# Patient Record
Sex: Male | Born: 1975 | Smoking: Never smoker
Health system: Southern US, Academic
[De-identification: ages and names within clinical notes are randomized; demographics above are authoritative.]

## PROBLEM LIST (undated history)

## (undated) DIAGNOSIS — Z9889 Other specified postprocedural states: Secondary | ICD-10-CM

## (undated) DIAGNOSIS — G4730 Sleep apnea, unspecified: Secondary | ICD-10-CM

## (undated) DIAGNOSIS — M778 Other enthesopathies, not elsewhere classified: Secondary | ICD-10-CM

## (undated) DIAGNOSIS — Z9989 Dependence on other enabling machines and devices: Secondary | ICD-10-CM

## (undated) DIAGNOSIS — K219 Gastro-esophageal reflux disease without esophagitis: Secondary | ICD-10-CM

## (undated) DIAGNOSIS — I10 Essential (primary) hypertension: Secondary | ICD-10-CM

## (undated) DIAGNOSIS — Z87442 Personal history of urinary calculi: Secondary | ICD-10-CM

## (undated) DIAGNOSIS — E785 Hyperlipidemia, unspecified: Secondary | ICD-10-CM

## (undated) DIAGNOSIS — R011 Cardiac murmur, unspecified: Secondary | ICD-10-CM

## (undated) HISTORY — PX: HX WISDOM TEETH EXTRACTION: SHX21

## (undated) HISTORY — PX: GANGLION CYST EXCISION: SHX1691

## (undated) HISTORY — PX: LITHOTRIPSY: SUR834

## (undated) HISTORY — PX: HEMORROIDECTOMY: SUR656

## (undated) HISTORY — PX: HX TONSILLECTOMY: SHX27

---

## 2020-02-18 ENCOUNTER — Other Ambulatory Visit: Payer: Self-pay

## 2020-02-18 ENCOUNTER — Ambulatory Visit (HOSPITAL_BASED_OUTPATIENT_CLINIC_OR_DEPARTMENT_OTHER): Payer: Managed Care, Other (non HMO) | Admitting: SURGERY OF THE HAND

## 2020-02-18 ENCOUNTER — Encounter (HOSPITAL_COMMUNITY): Payer: Self-pay

## 2020-02-18 ENCOUNTER — Encounter (HOSPITAL_BASED_OUTPATIENT_CLINIC_OR_DEPARTMENT_OTHER): Payer: Self-pay | Admitting: SURGERY OF THE HAND

## 2020-02-18 ENCOUNTER — Ambulatory Visit
Admission: RE | Admit: 2020-02-18 | Discharge: 2020-02-18 | Disposition: A | Discharge status: Home / Self Care | Payer: Managed Care, Other (non HMO) | Source: Ambulatory Visit | Attending: SURGERY OF THE HAND | Admitting: SURGERY OF THE HAND

## 2020-02-18 ENCOUNTER — Ambulatory Visit (HOSPITAL_COMMUNITY)
Admission: RE | Admit: 2020-02-18 | Discharge: 2020-02-18 | Disposition: A | Discharge status: Home / Self Care | Payer: Managed Care, Other (non HMO) | Source: Ambulatory Visit

## 2020-02-18 ENCOUNTER — Ambulatory Visit (INDEPENDENT_AMBULATORY_CARE_PROVIDER_SITE_OTHER): Payer: Managed Care, Other (non HMO) | Admitting: Rheumatology

## 2020-02-18 VITALS — BP 129/86 | HR 91 | Temp 97.2°F | Resp 16 | Wt 243.2 lb

## 2020-02-18 VITALS — BP 118/82 | HR 78 | Temp 98.4°F | Ht 69.02 in | Wt 243.4 lb

## 2020-02-18 DIAGNOSIS — Z01818 Encounter for other preprocedural examination: Secondary | ICD-10-CM | POA: Insufficient documentation

## 2020-02-18 DIAGNOSIS — S66912A Strain of unspecified muscle, fascia and tendon at wrist and hand level, left hand, initial encounter: Secondary | ICD-10-CM

## 2020-02-18 DIAGNOSIS — I10 Essential (primary) hypertension: Secondary | ICD-10-CM

## 2020-02-18 DIAGNOSIS — X509XXA Other and unspecified overexertion or strenuous movements or postures, initial encounter: Secondary | ICD-10-CM | POA: Insufficient documentation

## 2020-02-18 DIAGNOSIS — S66919A Strain of unspecified muscle, fascia and tendon at wrist and hand level, unspecified hand, initial encounter: Secondary | ICD-10-CM

## 2020-02-18 DIAGNOSIS — S66812A Strain of other specified muscles, fascia and tendons at wrist and hand level, left hand, initial encounter: Secondary | ICD-10-CM | POA: Insufficient documentation

## 2020-02-18 HISTORY — DX: Cardiac murmur, unspecified: R01.1

## 2020-02-18 HISTORY — DX: Sleep apnea, unspecified: G47.30

## 2020-02-18 HISTORY — DX: Other specified postprocedural states: Z98.890

## 2020-02-18 HISTORY — DX: Essential (primary) hypertension: I10

## 2020-02-18 HISTORY — DX: Personal history of urinary calculi: Z87.442

## 2020-02-18 HISTORY — DX: Dependence on other enabling machines and devices: Z99.89

## 2020-02-18 HISTORY — DX: Other enthesopathies, not elsewhere classified: M77.8

## 2020-02-18 HISTORY — DX: Gastro-esophageal reflux disease without esophagitis: K21.9

## 2020-02-18 HISTORY — DX: Hyperlipidemia, unspecified: E78.5

## 2020-02-18 LAB — BASIC METABOLIC PANEL
ANION GAP: 10 mmol/L (ref 4–13)
BUN/CREA RATIO: 15 (ref 6–22)
BUN: 19 mg/dL (ref 8–25)
CALCIUM: 9.3 mg/dL (ref 8.5–10.2)
CHLORIDE: 104 mmol/L (ref 96–111)
CO2 TOTAL: 26 mmol/L (ref 22–32)
CREATININE: 1.23 mg/dL (ref 0.62–1.27)
GLUCOSE: 95 mg/dL (ref 65–125)
POTASSIUM: 3.6 mmol/L (ref 3.5–5.1)
SODIUM: 140 mmol/L (ref 136–145)

## 2020-02-18 LAB — ECG 12-LEAD (PERFORMED IN PREADMISSION UNIT ONLY)
Atrial Rate: 85 {beats}/min
Calculated P Axis: 41 degrees
Calculated T Axis: 39 degrees
PR Interval: 124 ms
QRS Duration: 100 ms
QT Interval: 374 ms
QTC Calculation: 445 ms
Ventricular rate: 85 {beats}/min

## 2020-02-18 LAB — POC BLOOD GLUCOSE (RESULTS): GLUCOSE, POC: 109 mg/dl — ABNORMAL HIGH (ref 70–105)

## 2020-02-19 NOTE — H&P (Signed)
PATIENT NAME: Martin Wilkins NUMBER:  [id number]  DATE OF SERVICE: 02/18/2020  DATE OF BIRTH:      HISTORY AND PHYSICAL    CHIEF COMPLAINT:  Left wrist pain.    HISTORY OF PRESENT ILLNESS:  Mr. Chester Sibert is a 44 year old male, new patient.  He is right-hand dominant.  The patient indicates that on January 16, 2020, he was swinging a suitcase up around his shoulder with his left wrist when he felt a pop and burning.  Most of the pain according to the patient seems to be around the first dorsal compartment area.  He was evaluated by his primary care down in the [place] area and an MRI of his left wrist was ordered.  This was completed on February 11, 2020.  The MRI result findings indicated a tear of the triangular fibrocartilage, diffuse thickening of the abductor pollicis longus, the extensor pollicis brevis was not seen, and he had tendinopathy of the extensor carpi ulnaris.  The patient has been wearing a splint, which he indicates helps to some degree.  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  He has been taking Aleve for the discomfort.  Quality of discomfort is described as "aching, sharp, dull, catching, locking".  The only other previous history he has on that wrist was a cyst removal 20-some years ago.    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    PAST SURGICAL HISTORY:  Hemorrhoidectomy, wisdom teeth removal, and tonsillectomy.    ALLERGIES:  No known allergies.    CURRENT MEDICATIONS:  1. Crestor.  2. Lisinopril.  3. Metoprolol.  4. Prilosec.    SOCIAL HISTORY:  Employed, currently on light duty as a Arboriculturist.  Denies smoking or the use of other nicotine products.  Denies recreational drug use.  Alcohol use is seldom.  Married.  He will accept blood  transfusions if needed.    FAMILY HISTORY:  Positive for diabetes and hypertension.    REVIEW OF SYSTEMS:  Positive for numbness and tingling, which is present in his right hand, weakness of grip and stiffness present in his left hand.    PHYSICAL EXAMINATION:  General appearance:  Appears healthy, well developed, well nourished, and in no acute distress.   Vital signs:  Blood pressure 118/82, pulse 78, temperature 36.9 degrees Celsius, respirations 16.   Skin:  Warm, dry and intact.  No evidence of bruising.   Head:  Normocephalic without evidence of trauma.   Eyes ears, nose and throat:  Eyes and ears are symmetrical.   Chest:  Breathing is nonlabored.  Respirations are easy and regular.   Cardiac:  Radial pulse is regular.   Abdomen:  Soft, nondistended.   Musculoskeletal:  Exam is limited to his left upper extremity.  Motor function of his hand is intact.  Able to extend his thumb, make an O, cross 2 fingers and spread them out.  Sensation is normal to light touch over the radial, ulnar, and median nerve distribution.  The patient is nontender over the ulnar aspect of his wrist, nontender over the mid aspect of his wrist.  In regard to his radial wrist, he is tender over the first dorsal compartment with palpation and manipulation of the thumb/wrist.   Neurological:  Alert and oriented x4.   Psychological:  Affect is appropriate to today's visit.    IMAGING DATA:  No x-rays were obtained today, however, Dr. Osvaldo Human did go in and review the MRI that was completed in [place] on February 11, 2020.    ASSESSMENT:  This is a 44 year old male with an extensor pollicis brevis rupture and abductor pollicis brevis tendinitis of the left wrist.    PLAN:  1. Discussed with the patient that the other findings on the MRI are consistent with age-related changes in regard to the TFCC tear and the ECU tendinitis.  What we feel he has done is ruptured the EPB and he has a generalized tendinitis in the first dorsal compartment  around the APB.  We provided the patient with a couple different options.  One would be to immobilize the wrist for further period of time to see if it starts to decrease his discomfort.  The other would be a surgical option of performing a release of the first dorsal compartment, as well as attempting to reattach the EPB tendon.  The patient was interested in the surgical option.  A surgery card was filled out and scanned over to Dr. Inda Castle.  We were able to make him a PAT appointment over at the [facility] today and his consent was scanned into media.  2. Surgery is planned for next week, February 24, 2020, and I discussed with Mr. Trammel avoiding any blood thinning products such as anti-inflammatories, vitamins, fish oil capsules, etc.  3. Discussed with Mr. Cappuccio that based on what occurs at the time of surgery, he may be off from work a minimum of 6 weeks, but if we do a tendon repair, then it would be up to 12 weeks before he could return to his regular work duties.  In any case, he should be off at least for 2 weeks immediately following surgery before a light duty return is considered.  4. Mr. Defina was given the opportunity to ask questions today and appeared satisfied with the treatment plan.     A shared visit with the cosigning physician was performed.    Colleen Merlyn Albert, APRN, NP-C  [facility]       Mattie Marlin, MD  Associate Professor, [facility] Department of Orthopaedics    I personally interviewed and examined this patient. I made the diagnosis and recommended the plan of care. The note was documented by the APRN who saw them with me.  My findings are consistant with Tendon rupture of wrist, initial encounter.  Mattie Marlin, MD 02/20/2020, 14:39                DD:  02/18/2020 16:17:08  DT:  02/18/2020 18:31:12 FP  D#:  [id number]

## 2020-02-24 ENCOUNTER — Ambulatory Visit (HOSPITAL_COMMUNITY): Payer: Managed Care, Other (non HMO) | Admitting: NURSE PRACTITIONER

## 2020-02-24 ENCOUNTER — Ambulatory Visit (HOSPITAL_BASED_OUTPATIENT_CLINIC_OR_DEPARTMENT_OTHER): Payer: Managed Care, Other (non HMO) | Admitting: Specialist

## 2020-02-24 ENCOUNTER — Encounter (HOSPITAL_COMMUNITY): Payer: Managed Care, Other (non HMO) | Admitting: SURGERY OF THE HAND

## 2020-02-24 ENCOUNTER — Encounter (HOSPITAL_COMMUNITY)
Admission: RE | Disposition: A | Discharge status: Home / Self Care | Payer: Self-pay | Source: Ambulatory Visit | Attending: SURGERY OF THE HAND

## 2020-02-24 ENCOUNTER — Inpatient Hospital Stay
Admission: RE | Admit: 2020-02-24 | Discharge: 2020-02-24 | Disposition: A | Discharge status: Home / Self Care | Payer: Managed Care, Other (non HMO) | Source: Ambulatory Visit | Attending: SURGERY OF THE HAND | Admitting: SURGERY OF THE HAND

## 2020-02-24 ENCOUNTER — Other Ambulatory Visit: Payer: Self-pay

## 2020-02-24 ENCOUNTER — Encounter (HOSPITAL_COMMUNITY): Payer: Self-pay | Admitting: SURGERY OF THE HAND

## 2020-02-24 DIAGNOSIS — S66812A Strain of other specified muscles, fascia and tendons at wrist and hand level, left hand, initial encounter: Secondary | ICD-10-CM

## 2020-02-24 DIAGNOSIS — M65822 Other synovitis and tenosynovitis, left upper arm: Secondary | ICD-10-CM | POA: Insufficient documentation

## 2020-02-24 DIAGNOSIS — Z01818 Encounter for other preprocedural examination: Secondary | ICD-10-CM

## 2020-02-24 DIAGNOSIS — M65842 Other synovitis and tenosynovitis, left hand: Secondary | ICD-10-CM

## 2020-02-24 DIAGNOSIS — Z9889 Other specified postprocedural states: Secondary | ICD-10-CM

## 2020-02-24 SURGERY — TENOSYNOVECTOMY
Anesthesia: General | Site: Finger | Laterality: Left | Wound class: Clean Wound: Uninfected operative wounds in which no inflammation occurred

## 2020-02-24 MED ORDER — SODIUM CHLORIDE 0.9 % (FLUSH) INJECTION SYRINGE
2.0000 mL | INJECTION | INTRAMUSCULAR | Status: DC | PRN
Start: 2020-02-24 — End: 2020-02-24

## 2020-02-24 MED ORDER — LACTATED RINGERS INTRAVENOUS SOLUTION
INTRAVENOUS | Status: DC
Start: 2020-02-24 — End: 2020-02-24

## 2020-02-24 MED ORDER — KETAMINE 10 MG/ML INJECTION WRAPPER
Freq: Once | INTRAMUSCULAR | Status: DC | PRN
Start: 2020-02-24 — End: 2020-02-24
  Administered 2020-02-24: 15:00:00 30 mg via INTRAVENOUS

## 2020-02-24 MED ORDER — SCOPOLAMINE 1 MG OVER 3 DAYS TRANSDERMAL PATCH
1.0000 | MEDICATED_PATCH | TRANSDERMAL | Status: DC
Start: 2020-02-24 — End: 2020-02-24
  Administered 2020-02-24: 1 via TRANSDERMAL

## 2020-02-24 MED ORDER — ONDANSETRON HCL (PF) 4 MG/2 ML INJECTION SOLUTION
Freq: Once | INTRAMUSCULAR | Status: DC | PRN
Start: 2020-02-24 — End: 2020-02-24
  Administered 2020-02-24: 4 mg via INTRAVENOUS

## 2020-02-24 MED ORDER — FENTANYL (PF) 50 MCG/ML INJECTION SOLUTION
25.0000 ug | INTRAMUSCULAR | Status: DC | PRN
Start: 2020-02-24 — End: 2020-02-24

## 2020-02-24 MED ORDER — MIDAZOLAM 1 MG/ML INJECTION SOLUTION
Freq: Once | INTRAMUSCULAR | Status: DC | PRN
Start: 2020-02-24 — End: 2020-02-24
  Administered 2020-02-24: 2 mg via INTRAVENOUS

## 2020-02-24 MED ORDER — FENTANYL (PF) 50 MCG/ML INJECTION SOLUTION
Freq: Once | INTRAMUSCULAR | Status: DC | PRN
Start: 2020-02-24 — End: 2020-02-24
  Administered 2020-02-24: 100 ug via INTRAVENOUS

## 2020-02-24 MED ORDER — DEXTROSE 5 % IN WATER (D5W) INTRAVENOUS SOLUTION
2.0000 g | Freq: Once | INTRAVENOUS | Status: AC
Start: 2020-02-24 — End: 2020-02-24
  Administered 2020-02-24: 2 g via INTRAVENOUS
  Filled 2020-02-24: qty 20

## 2020-02-24 MED ORDER — FENTANYL (PF) 50 MCG/ML INJECTION SOLUTION
12.5000 ug | INTRAMUSCULAR | Status: DC | PRN
Start: 2020-02-24 — End: 2020-02-24

## 2020-02-24 MED ORDER — BUPIVACAINE (PF) 0.25 % (2.5 MG/ML) INJECTION SOLUTION
20.00 mL | Freq: Once | INTRAMUSCULAR | Status: AC
Start: 2020-02-24 — End: 2020-02-24
  Administered 2020-02-24: 10 mL
  Filled 2020-02-24: qty 30

## 2020-02-24 MED ORDER — APREPITANT 40 MG CAPSULE
40.00 mg | ORAL_CAPSULE | Freq: Once | ORAL | Status: AC
Start: 2020-02-24 — End: 2020-02-24
  Administered 2020-02-24: 40 mg via ORAL

## 2020-02-24 MED ORDER — HYDROMORPHONE 2 MG/ML INJECTION SYRINGE
0.4000 mg | INJECTION | INTRAMUSCULAR | Status: DC | PRN
Start: 2020-02-24 — End: 2020-02-24

## 2020-02-24 MED ORDER — SODIUM CHLORIDE 0.9 % (FLUSH) INJECTION SYRINGE
2.0000 mL | INJECTION | Freq: Three times a day (TID) | INTRAMUSCULAR | Status: DC
Start: 2020-02-24 — End: 2020-02-24

## 2020-02-24 MED ORDER — HYDROMORPHONE 2 MG/ML INJECTION SYRINGE
0.2000 mg | INJECTION | INTRAMUSCULAR | Status: DC | PRN
Start: 2020-02-24 — End: 2020-02-24

## 2020-02-24 MED ORDER — HYDROCODONE 5 MG-ACETAMINOPHEN 325 MG TABLET
1.0000 | ORAL_TABLET | Freq: Once | ORAL | Status: DC | PRN
Start: 2020-02-24 — End: 2020-02-24
  Administered 2020-02-24: 1 via ORAL
  Filled 2020-02-24 (×2): qty 1

## 2020-02-24 MED ORDER — LIDOCAINE (PF) 100 MG/5 ML (2 %) INTRAVENOUS SYRINGE
INJECTION | Freq: Once | INTRAVENOUS | Status: DC | PRN
Start: 2020-02-24 — End: 2020-02-24
  Administered 2020-02-24: 100 mg via INTRAVENOUS

## 2020-02-24 MED ORDER — HYDROCODONE 5 MG-ACETAMINOPHEN 325 MG TABLET
1.00 | ORAL_TABLET | ORAL | 0 refills | Status: DC | PRN
Start: 2020-02-24 — End: 2020-03-10

## 2020-02-24 MED ORDER — PROPOFOL 10 MG/ML INTRAVENOUS EMULSION
Freq: Once | INTRAVENOUS | Status: DC | PRN
Start: 2020-02-24 — End: 2020-02-24
  Administered 2020-02-24: 200 mg via INTRAVENOUS
  Administered 2020-02-24: 35 ug via INTRAVENOUS

## 2020-02-24 MED ORDER — SODIUM CHLORIDE 0.9 % (FLUSH) INJECTION SYRINGE
2.00 mL | INJECTION | Freq: Three times a day (TID) | INTRAMUSCULAR | Status: DC
Start: 2020-02-24 — End: 2020-02-24

## 2020-02-24 MED ORDER — SODIUM CHLORIDE 0.9 % (FLUSH) INJECTION SYRINGE
2.00 mL | INJECTION | INTRAMUSCULAR | Status: DC | PRN
Start: 2020-02-24 — End: 2020-02-24

## 2020-02-24 MED ORDER — DEXMEDETOMIDINE 4 MCG/ML IV DILUTION
Freq: Once | INTRAMUSCULAR | Status: DC | PRN
Start: 2020-02-24 — End: 2020-02-24
  Administered 2020-02-24: 15:00:00 40 ug via INTRAVENOUS

## 2020-02-24 SURGICAL SUPPLY — 26 items
BANDAGE HK-LOCK 5YDX2IN KNTD E LAS UNQ LPLK DSGN VLCR FRN (WOUND CARE/ENTEROSTOMAL SUPPLY) ×1
BANDAGE HOOK-LOCK 5YDX2IN STRL KNTD ELAS UNQ LPLK DSGN VELCRO FRN CLSR COTTON PLSTR YARN COMPRESS LF (WOUND CARE SUPPLY) ×2 IMPLANT
BANDAGE HOOK-LOCK 5YDX4IN STRL KNIT ELAS UNQ LPLK DSGN WSHBL FRN CLSR VELCRO PLSTR YARN COTTON (WOUND CARE SUPPLY) ×2 IMPLANT
BLANKET MISTRAL-AIR ADULT LWR BODY 55.9X40.2IN FRC AIR HI VOL BLWR INTUITIVE CONTROL PNL LRG LED (MISCELLANEOUS PT CARE ITEMS) ×2 IMPLANT
BLANKET WARMER 55.9INW X 40.2I_LOWER BODY MISTRAL-AIR (MISCELLANEOUS PT CARE ITEMS) ×1
CONV USE ITEM 337890 - PACK SURG BSIN 2 STRL LF  DISP (CUSTOM TRAYS & PACK) ×2 IMPLANT
DISCONTINUED NO SUB - PADDING CAST 4YDX2IN WEBRIL CO_T UNDCST MILD STRCH CHSV CRMP (CAST) ×2 IMPLANT
DISCONTINUED USE ITEM 83862 - PADDING CAST 4YDX3IN WEBRIL CO_T UNDCST MILD STRCH CHSV CRMP (CAST) ×2 IMPLANT
DRAPE POUCH INSTRU 2 POCKET_1018 10EA/BX (EQUIPMENT MINOR) ×1
GARMENT COMPRESS MED CALF CENTAURA NYL VASOGRAD LTWT BRTHBL SEQ FIL BLU 18- IN (ORTHOPEDICS (NOT IMPLANTS)) ×2 IMPLANT
GAUZE SPONGE 4 X 4IN STRL NON21426 (WOUND CARE/ENTEROSTOMAL SUPPLY) ×1
GAUZE SPONGE 4 X 4IN STRL_NON21426 (WOUND CARE SUPPLY) ×2 IMPLANT
GOWN SURG 2XL L3 REINF HKLP CLSR SET IN SLEEVE STRL LF  DISP BLU SIRUS SMS 49IN (PROTECTIVE PRODUCTS/GARMENTS) ×2 IMPLANT
PACK BASIN DBL CUSTOM (CUSTOM TRAYS & PACK) ×1
PACK HAND TRAY ORTHO REVISED_UTC (CUSTOM TRAYS & PACK) ×1
PACK SURG UTC HAND STRL DISP LF (CUSTOM TRAYS & PACK) ×2 IMPLANT
PAD ABDOMINAL 10X8IN CLU ABS N_WVN SFT HDRPHB BCK LF STRL (WOUND CARE/ENTEROSTOMAL SUPPLY) ×6
PADDING CAST 4YDX3IN WEBRIL CO T UNDCST MILD STRCH CHSV CRMP (CAST) ×1
POUCH 11X7IN 2 ADH STRP 2 CMPRT STRDRP INSTR PLASTIC STRL (EQUIPMENT MINOR) ×2 IMPLANT
SPLINT ORTHO 15FTX4IN RL FBGLS ORTHOGLASS LF (CAST) IMPLANT
SPLINT ORTHO 15FTX4IN WRST WRN_KL FREE STRCH PAD CVR INTLK (CAST)
SPLINT ORTHO 15FTX5IN RL FBGLS ORTHOGLASS LF (CAST) IMPLANT
SPLINT ORTHO 15FTX5IN WRST WRN_KL FREE STRCH PAD CVR FBGLS (CAST)
TRAY SKIN SCRUB 8IN VNYL COTTON 6 WNG 6 SPONGE STICK 2 TIP APPL DRY STRL LF (KITS & TRAYS (DISPOSABLE)) ×2 IMPLANT
TRAY SURG PREP SCR CR ESTM (KITS & TRAYS (DISPOSABLE)) ×1
XPAD ABDOMINAL 10X8IN CLU ABS N_WVN SFT HDRPHB BCK LF STRLX (WOUND CARE SUPPLY) ×4 IMPLANT

## 2020-02-24 NOTE — Anesthesia Transfer of Care (Signed)
ANESTHESIA TRANSFER OF CARE   Martin Wilkins is a 44 y.o. ,male,     had Procedure(s) with comments:  TENOSYNOVECTOMY - tenosynovectomy 4 tendons of compartment; release 1st dorsal compartment left wrist  performed  02/24/20   Primary Service: Mattie Marlin, MD    Past Medical History:   Diagnosis Date   . CPAP (continuous positive airway pressure) dependence     for sleep apnea   . GERD (gastroesophageal reflux disease)    . Heart murmur     seen by cardiology a "long time ago" and didn't need follow-up   . History of kidney stones    . HTN (hypertension)    . Hyperlipidemia    . PONV (postoperative nausea and vomiting)    . Sleep apnea    . Tendonitis of wrist, left       Allergy History as of 02/24/20      No Known Allergies              I completed my transfer of care / handoff to the receiving personnel during which we discussed:  Access, Antibiotics, Labs, PMHx, Expectation of post procedure, Airway, All key/critical aspects of case discussed and Fluids/Product    Post Location: PACU                                                                Last OR Temp: Temperature: 36.4 C (97.5 F)  ABG:  POTASSIUM   Date Value Ref Range Status   02/18/2020 3.6 3.5 - 5.1 mmol/L Final     CALCIUM   Date Value Ref Range Status   02/18/2020 9.3 8.5 - 10.2 mg/dL Final     Calculated P Axis   Date Value Ref Range Status   02/18/2020 41 degrees Final     Calculated T Axis   Date Value Ref Range Status   02/18/2020 39 degrees Final     Airway:* No LDAs found *  Blood pressure 101/67, pulse 85, temperature 36.4 C (97.5 F), resp. rate 16, SpO2 99 %.

## 2020-02-24 NOTE — Anesthesia Postprocedure Evaluation (Signed)
Anesthesia Post Op Evaluation    Patient: Martin Wilkins  Procedure(s) with comments:  TENOSYNOVECTOMY - tenosynovectomy 4 tendons of compartment; release 1st dorsal compartment left wrist    Last Vitals:Temperature: 36.4 C (97.5 F) (02/24/20 1545)  Heart Rate: 85 (02/24/20 1545)  BP (Non-Invasive): 101/67 (02/24/20 1545)  Respiratory Rate: 16 (02/24/20 1545)  SpO2: 99 % (02/24/20 1545)    No complications documented.    Patient is sufficiently recovered from the effects of anesthesia to participate in the evaluation and has returned to their pre-procedure level.  Patient location during evaluation: PACU       Patient participation: complete - patient participated  Level of consciousness: awake and alert and responsive to verbal stimuli    Pain management: adequate  Airway patency: patent    Anesthetic complications: no  Cardiovascular status: acceptable  Respiratory status: acceptable  Hydration status: acceptable  Patient post-procedure temperature: Pt Normothermic   PONV Status: Absent

## 2020-02-24 NOTE — Brief Op Note (Signed)
[facility]                                                     BRIEF OPERATIVE NOTE    Patient Name: Tolbert, Matheson Number: [id number]  Date of Service: 02/24/2020   Date of Birth:     All elements must be documented.    Pre-Operative Diagnosis: Left wrist EPB tendon rupture and APB tendinitis.   Post-Operative Diagnosis: Intact extensor tendons with scar adhesion first dorsal compartment left wrist.  Procedure(s)/Description:  Release of first dorsal compartment; tenosynovectomy of 4 tendons within the compartment, release of scar adhesion. Application of thumb spica splint.  Findings/Complexity (inherent to the procedure performed): See formal operative report.     Attending Surgeon: Jomarie Longs Prud'Homme MD  Assistant(s): Rolanda Jay APRN    Anesthesia Type: General/LMA  Tourniquet time left arm: 22 minutes. Local 0.25% marcaine anesthetic: 49mL.   Estimated Blood Loss:  Minimal  Blood Given: None  Fluids Given: IV Crystalloid  Complications (not routinely expected or not inherent to difficulty/nature of procedure): None  Characteristic Event (routinely expected or inherent to the difficulty/nature of the procedure): Routine.  Did the use of current and/or prior Anticoagulants impact the outcome of the case? no  Wound Class: Clean Wound: Uninfected operative wounds in which no inflammation occurred    Tubes: None  Drains: None  Specimens/ Cultures: None  Implants: None           Disposition: PACU - hemodynamically stable.  Condition: stable    Rolanda Jay, [place]

## 2020-02-24 NOTE — H&P (Signed)
[facility]                                                     H&P Update Form    Martin Wilkins, Martin Wilkins, 44 y.o. male  Date of Admission:  (Not on file)  Date of Birth:      02/24/2018    Pre-Surgical H & P updated the day of the procedure.  1.  H&P completed within 30 days of surgical procedure was performed by Rolanda Jay APRN on 02/18/2020 and has been reviewed, the patient has been examined, and no change has occured in the patients condition since the H&P was completed.     Comments: No medication changes, denies cp/sob/nvd/fevers/chills, surgical site marked, consent in chart.    2.  Patient continues to be appropiate candidate for planned surgical procedure. YES    --  Rolanda Jay APRN  Orthopaedics Dept  [address] [place], [place] 02/24/2020, 08:53

## 2020-02-24 NOTE — Progress Notes (Signed)
[facility]  Orthopaedics Post Op Note/Discharge Summary  Date of Service: 02/24/2020    Patient: Martin Wilkins  MRN: [id number]  DOB:     SUBJECTIVE:  Patient to PACU in hemodynamically stable condition. Resting comfortably, maintaining airway. Pain tolerable    OBJECTIVE:   NAD  AFVSS  Extremity: Left wrist dressing and thumb spica splint intact. Fingers pink and well perfused.    ASSESSMENT: 44 y.o. male day of surgery s/p release of left wrist first dorsal compartment. Tenosynovectomy of 4 tendons withing the compartment. Release of scar adhesion.    PLAN:  - resume all home medications   - WB left wrist: For first couple of days 2lb push, pull lift limit. Remove dressing and splint on day #3 and begin wrist motion.   - DVT prophylaxis:  Encourage ambulation and ROM in all normal extremities  - Antibiotics: Given intra operatively.  - pain control Norco 5/321m 1-2 tablets every 4 hours prn pain. #30 tablets.  Opioid Prescription - First Prescription  Diagnosis requiring prescription: rupture of left wrist EPB tendon.    Medication Dosage/Frequency being prescribed: Norco 5/3253m1 tablet every 4 hours prn pain. #30 tablets    I have reviewed prior medication history in the medical record for this patient.  I have also reviewed information contained in the state controlled prescription drug monitoring database.    I have discussed any history of non-pharmacological treatment with the patient.  The patient reports no prior treatment history.      The patient denies history of substance abuse treatment.      Physical exam findings and/or clinical history warranting use of opioid treatment include: post oeprative pain.     My goals for treatment include: gradual weaning of narcotic pain medication.    I have discussed the risk of opioid addiction with the patient.  I have also discussed the risk of using sedatives and alcohol while taking opioids. Based on my assessment, I believe the  planned course of treatment to be safe and effective.    - diet resume general diet when good po  - Will see back in Dr. PrFrederik Schmidtr [facility] Kagel's clinic in 2 weeks  - Dispo: D/C to home when recovery area discharge criteria is met    CoGillis SantaAPRN,NP-C 02/24/2020, 08:58  [facility]

## 2020-02-24 NOTE — Anesthesia Preprocedure Evaluation (Signed)
ANESTHESIA PRE-OP EVALUATION  Planned Procedure: REPAIR EXTENSOR TENDON FINGER (Left Finger)  Review of Systems    PONV       patient summary reviewed          Pulmonary   sleep apnea and CPAP,   Cardiovascular    Hypertension and murmur (Heart murmur  seen by cardiology a "long time ago" and didn't need follow-up ) ,       GI/Hepatic/Renal    neprholithiasis and GERD     Endo/Other    Tendonitis of wrist, left,       Neuro/Psych/MS        Cancer  negative hematology/oncology ROS,                    Physical Assessment      Patient summary reviewed   Airway       Mallampati: III      Neck ROM: full  Mouth Opening: good.            Dental                    Pulmonary    Breath sounds clear to auscultation       Cardiovascular    Rhythm: regular  Rate: Normal       Other findings            Plan  ASA 2     Planned anesthesia type: general                            Patient's NPO status is appropriate for Anesthesia.

## 2020-02-24 NOTE — Discharge Instructions (Signed)
SURGICAL DISCHARGE INSTRUCTIONS     Dr. Mattie Marlin, MD  performed your TENOSYNOVECTOMY today at the [facility]    [facility]:  Monday through Friday from 6 a.m. - 7 p.m.: [phone]  Between 7 p.m. - 6 a.m., weekends and holidays:  Call [facility] at [phone] or [phone].    PLEASE SEE WRITTEN HANDOUTS AS DISCUSSED BY YOUR NURSE:      SIGNS AND SYMPTOMS OF A WOUND / INCISION INFECTION   Be sure to watch for the following:   Increase in redness or red streaks near or around the wound or incision.   Increase in pain that is intense or severe and cannot be relieved by the pain medication that your doctor has given you.   Increase in swelling that cannot be relieved by elevation of a body part, or by applying ice, if permitted.   Increase in drainage, or if yellow / green in color and smells bad. This could be on a dressing or a cast.   Increase in fever for longer than 24 hours, or an increase that is higher than 101 degrees Fahrenheit (normal body temperature is 98 degrees Fahrenheit). The incision may feel warm to the touch.    **CALL YOUR DOCTOR IF ONE OR MORE OF THESE SIGNS / SYMPTOMS SHOULD OCCUR.    ANESTHESIA INFORMATION   ANESTHESIA -- ADULT PATIENTS:  You have received intravenous sedation / general anesthesia, and you may feel drowsy and light-headed for several hours. You may even experience some forgetfulness of the procedure. DO NOT DRIVE A MOTOR VEHICLE or perform any activity requiring complete alertness or coordination until you feel fully awake in about 24-48 hours. Do not drink alcoholic beverages for at least 24 hours. Do not stay alone, you must have a responsible adult available to be with you. You may also experience a dry mouth or nausea for 24 hours. This is a normal side effect and will disappear as the effects of the medication wear off.    REMEMBER   If you experience any difficulty breathing, chest pain, bleeding that you feel is  excessive, persistent nausea or vomiting or for any other concerns:  Call your physician Dr. Osvaldo Human at [phone] or [phone]. You may also ask to have the doctor on call paged. They are available to you 24 hours a day.    SPECIAL INSTRUCTIONS / COMMENTS   See attached    FOLLOW-UP APPOINTMENTS   Please call patient services at [phone] or [phone] to schedule a date / time of return. They are open Monday - Friday from 7:30 am - 5:00 pm.

## 2020-02-25 ENCOUNTER — Ambulatory Visit (HOSPITAL_BASED_OUTPATIENT_CLINIC_OR_DEPARTMENT_OTHER): Payer: Self-pay | Admitting: Family

## 2020-02-25 NOTE — OR Surgeon (Signed)
PATIENT NAME: Martin Wilkins, Martin Wilkins   HOSPITAL NUMBER:  [id number]  DATE OF SERVICE: 02/24/2020  DATE OF BIRTH:      OPERATIVE REPORT    PREOPERATIVE DIAGNOSIS:  Tendinitis and possible tendon rupture, left first dorsal compartment of the wrist.    POSTOPERATIVE DIAGNOSIS:  Extensive tendon adhesions and tenosynovitis of the first dorsal compartment tendons.    NAME OF PROCEDURES:  1. Tenolysis of extensor tendons x4.   2. Release of first dorsal compartment.    3. Placement of thumb spica splint.    SURGEON:  Mattie Marlin, MD.    ASSISTANT:  Barnabas Lister, APRN, NP-C, who assisted with the procedure from beginning to end.  Residents were assigned to other attendings and they were not available to help.    ANESTHESIA:  Laryngeal mask airway general.    ESTIMATED BLOOD LOSS:  Trace.    TOURNIQUET TIME:  22 minutes at 250 mmHg.    SPECIMEN:  None.    DRAINS:  None.    IMPLANTS:  None.    WOUND TYPE:  Clean.    COMPLICATIONS:  None.    DISPOSITION:  The patient was taken to recovery room awake and extubated with stable vital signs in good condition.  He tolerated the procedure well.    DESCRIPTION OF PROCEDURE:  [name] was taken the OR.  He was placed on the table in a supine position.  General anesthesia was induced.  He was given antibiotics as prophylaxis against infection.  A well-padded tourniquet was placed about his left upper extremity.  The arm was then triple prepped first with chlorhexidine scrub then alcohol scrub and ChloraPrep.  He was draped in a sterile fashion.  A time-out was done and documented.  Confirmation of prophylactic antibiotics was made.  The arm was then exsanguinated with an Esmarch.  Tourniquet raised to 250 mmHg.  I traced the tendons of the first dorsal compartment all the way from the base of the thumb metacarpal through the first dorsal compartment and above the forearm.  This was not a regular de Quervain release.  I made a 2-inch incision beginning at  the base of the metacarpal and traveling proximally to the end of the radial styloid and then slightly further up onto the forearm.  A sharp incision was made through the skin.  I then dissected bluntly through the fat until I found the main branch of the superficial radial nerve which was protected.  I then dissected down to the extensor tendons.  I found the extensor and abductor tendons distal to the radial styloid.  They were very scarred together.  All the tenosynovium from the tendons was scarred to one another.  I released the dorsal edge of the first dorsal compartment completely up into the forearm fascia.  Inside I found 4 tendons all of which were scarred to one another.  They were adherent all the way from the base of the thumb metacarpal through the first dorsal compartment up into the distal forearm about 2 inches above the radial styloid before I bumped into the muscular junction and things began looked normal.  All the tendons were completely matted together.  Carefully using tenotomy scissors, I dissected the 4 tendons separately from one another.  I followed them all the way to the base of the thumb.  I stripped the tenosynovium and freeing up the multiple tendon adhesions.  They were really just all scarred together like someone had poured glue over them.  Once I had them all the way free up to the musculotendinous junction of each tendon, I checked to make sure that there were no other tendon slips.  There were no hidden compartments inside he first dorsal compartment and there were not.  I then pulled on each tendon to identify the function.  There were 3 extensors and 1 abductor.  They all moved independently of one another and were freed up for their entire length.  I copiously irrigated everything.  I made sure that they replaced into the first dorsal compartment and that the retinaculum was not trapped beneath the tendons.  I then copiously irrigated everything with normal saline.  I closed  with 3-0 Vicryl in the fat and the dermis and a running 4-0 Monocryl subcuticular closure in the skin.  I injected 10 mL of 0.25% plain Marcaine for postoperative analgesia.  I then placed a sterile dressing of Adaptic, folded ABD pad, cotton Webril and a 4-inch Orthoglass splint which I made into a thumb spica.  I held this on with a 2 and a 4 inch Ace wrap.  Once the splint was hard, the tourniquet was let down.  There was no significant bleeding.  Good circulation immediately returned to the hand.  The patient was awakened and extubated in the operating room.  He was transferred to a stretcher and taken to recovery room with stable vital signs in good condition.  Once in the recovery room, per his request, I talked to his stepfather who was waiting for him and went over the discharge instructions.  They were also given to the patient written form.  He is to leave the splint in place for 3 days.  On the third day, he can remove the splint and shower, but not submerge the hand.  He can start gentle use of the hand and wrist then.  I rather him not exceed about 5 pounds   lifting limit until we see him back in 2 weeks in clinic.  If he has any problems or questions, he was given numbers where he could reach [place] at any time and encouraged to call.        Ali Lowe, MD  Associate Professor, [place] [facility] Orthopaedics              DD:  02/24/2020 18:16:59  DT:  02/25/2020 07:10:42 CB  D#:  [id number]

## 2020-02-25 NOTE — Telephone Encounter (Addendum)
-----   Message from [facility] sent at 02/25/2020  1:11 PM EDT -----  Needs  to make sure he understands the post op instructions about washing his incision area.     02/25/2020 Called and spoke with patient. Reinforced discharge instructions to keep splint and dressing on for 48 hours then can remove and begin gentle wrist motion. 5lb push, pull, lift limit.  Patient stated he understood.  [address] [place], [place]  02/25/2020, 13:50

## 2020-02-26 ENCOUNTER — Ambulatory Visit (HOSPITAL_BASED_OUTPATIENT_CLINIC_OR_DEPARTMENT_OTHER): Payer: Self-pay | Admitting: Family

## 2020-02-26 NOTE — Telephone Encounter (Addendum)
-----   Message from [place] sent at 02/26/2020  1:56 PM EDT -----  wants to know if he needs to put the splint back on after taking of his dressing    02/26/2020 Called and spoke with patient. Discussed that he does not need to wear the splint after 48 hours. Patient indicated he understood.  [address] [place], [place]  02/26/2020, 14:18

## 2020-03-10 ENCOUNTER — Other Ambulatory Visit: Payer: Self-pay

## 2020-03-10 ENCOUNTER — Ambulatory Visit: Payer: Managed Care, Other (non HMO) | Attending: SURGERY OF THE HAND | Admitting: SURGERY OF THE HAND

## 2020-03-10 DIAGNOSIS — Z4789 Encounter for other orthopedic aftercare: Secondary | ICD-10-CM | POA: Insufficient documentation

## 2020-03-10 DIAGNOSIS — M654 Radial styloid tenosynovitis [de Quervain]: Secondary | ICD-10-CM | POA: Insufficient documentation

## 2020-03-11 NOTE — Progress Notes (Addendum)
PATIENT NAME: Martin Wilkins, Martin Wilkins   HOSPITAL NUMBER:  [id number]  DATE OF SERVICE: 03/10/2020  DATE OF BIRTH:      PROGRESS NOTE    DATE OF SURGERY:  February 24, 2020.    PROCEDURE:  1. Tenolysis of extensor tendons x4.   2. Release of left first dorsal compartment.    CHIEF COMPLAINT:  Postop visit #1.    SUBJECTIVE:  Martin Wilkins is a 44 year old male established patient.  He is now 2 weeks out from surgery.  The patient initially had a traumatic wrist injury when he slung a suitcase back over his shoulder and felt a pop.  MRI suggested that 1 of the wrist tendons was not able to be seen and due to the amount of pain he was having there was some question of a rupture.  We took him to the operating room to find that his tendons had all kind of melded together and essentially he had an extensive tendon adhesion and tenosynovitis of the first dorsal compartment tendons.  The patient returns today indicating that he feels that he is doing well.  He has much less pain.  He has been off work through his primary care physician up until and through this week and is questioning whether or not he would be allowed to return to work.  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    OBJECTIVE:  Upon physical exam, he is well developed, well nourished, and in no acute distress.  He is accompanied to the visit today by his son.   No vital signs were obtained today.   Neurologic:  Alert and oriented x4.   Psychologic:  Affect is appropriate to today's visit.   Musculoskeletal:  Exam is limited to his left upper extremity.  The patient has a well-approximated incision with no evidence of redness or wound drainage.  All the suturing was done subcuticularly.  Motor function of his hand is intact.  He is able to extend his thumb, make an O, cross 2 fingers and spread them out.  Sensation is normal to light touch over the radial, ulnar, and median nerve distribution.    Cardiac:  2+ radial  pulse.    IMAGING:  No x-rays were obtained today.    ASSESSMENT:  This is a 44 year old male who is now 2 weeks out from tenolysis of extensor tendons x4, and release of left first dorsal compartment.    PLAN:  1. Discussed with the patient that up until the 6-week mark, we would only want him to push, pull or lift about 20 pounds.  Overall we feel he is doing well, but he could backslide if he was too aggressive with use of the left wrist.  2. Mr. Esquivel was provided with a work excuse indicating that he would remain off work through his next appointment which is scheduled for Apr 07, 2020.   3. Mr. Centola was given the opportunity to ask questions today and appeared satisfied with the treatment plan.     A shared visit with the cosigning physician was performed.    Colleen Merlyn Albert, APRN, NP-C  [place] Department of Orthopaedics       Mattie Marlin, MD  Associate Professor, [facility] Department of Orthopaedics    I personally interviewed and examined this patient. I made the diagnosis and recommended the plan of care. The note was documented by the APRN who saw them with  me.  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  Ali Lowe, MD 03/11/2020, 14:17                DD:  03/10/2020 16:23:18  DT:  03/11/2020 08:39:09 LL  D#:  [id number]

## 2020-03-15 ENCOUNTER — Ambulatory Visit (HOSPITAL_BASED_OUTPATIENT_CLINIC_OR_DEPARTMENT_OTHER): Payer: Self-pay | Admitting: SURGERY OF THE HAND

## 2020-03-17 ENCOUNTER — Ambulatory Visit (HOSPITAL_BASED_OUTPATIENT_CLINIC_OR_DEPARTMENT_OTHER): Payer: Self-pay | Admitting: SURGERY OF THE HAND

## 2020-03-17 NOTE — Telephone Encounter (Signed)
Regarding: Forms  ----- Message from Ma Hillock sent at 03/17/2020 11:14 AM EDT -----  Dr. Osvaldo Human Pt    Pt is requesting a return call from [facility] to discuss the forms that were faxed to [place] yesterday from [employer].     Thank you    ----- Message from Ladona Horns sent at 03/16/2020  8:24 AM EDT -----  [facility] pt     Pt called. Wants to know if you've received forms from [employer] for his medical leave.   Please call him to advise.     Thanks

## 2020-03-17 NOTE — Telephone Encounter (Signed)
RN called the patient to let him know the paperwork was completed and sent to [employer] and forwarded to medical records for that portion of things.  RN also give him the phone number for medical records.

## 2020-03-18 ENCOUNTER — Encounter (HOSPITAL_BASED_OUTPATIENT_CLINIC_OR_DEPARTMENT_OTHER): Payer: Self-pay

## 2020-03-18 NOTE — Nursing Note (Signed)
Documentation completed and faxed to [employer].  Confirmation received.  Forms scanned into patient chart for our records.      The form has been forwarded to Medical Records for that request.

## 2020-03-29 ENCOUNTER — Ambulatory Visit (HOSPITAL_BASED_OUTPATIENT_CLINIC_OR_DEPARTMENT_OTHER): Payer: Self-pay | Admitting: SURGERY OF THE HAND

## 2020-03-31 ENCOUNTER — Encounter (HOSPITAL_BASED_OUTPATIENT_CLINIC_OR_DEPARTMENT_OTHER): Payer: Self-pay | Admitting: SURGERY OF THE HAND

## 2020-03-31 ENCOUNTER — Other Ambulatory Visit: Payer: Self-pay

## 2020-03-31 ENCOUNTER — Ambulatory Visit: Payer: Managed Care, Other (non HMO) | Attending: SURGERY OF THE HAND | Admitting: SURGERY OF THE HAND

## 2020-03-31 DIAGNOSIS — Z9889 Other specified postprocedural states: Secondary | ICD-10-CM | POA: Insufficient documentation

## 2020-04-01 NOTE — Progress Notes (Signed)
PATIENT NAME: Martin Wilkins, Martin Wilkins   HOSPITAL NUMBER:  [id number]  DATE OF SERVICE: 03/31/2020  DATE OF BIRTH:      PROGRESS NOTE    DATE OF SURGERY:  February 24, 2020, for release of the left first dorsal compartment tenolysis of extensor tendons x4.    SUBJECTIVE:  The patient is right-hand dominant male who comes in today about 5 weeks status post the above-mentioned procedure.  He says he has been doing very well.  He says he has no pain whatsoever.  No numbness or tingling.  He is very happy with the procedure.  He said he has a little bit of soreness when he puts his thumb in a slight Finkelstein maneuver.  However, it is very minor and he is overall very happy.    OBJECTIVE:  The patient is well appearing in no acute distress.  Nonlabored breathing.  Examination of left upper extremity shows no deformity compared to the contralateral.  The surgical incision is very well healed to the radial aspect of the thumb.  He can perform a thumbs up, A-OK sign, finger abduction and adduction intact.  Sensation intact to all medial, radial and ulnar nerve distributions of the hand without any change to the contralateral.  Brisk cap refill to all fingers.  Palpable radial pulse.  He has full extension of the thumb, full extension of the fingers, and full range of motion of the wrist compared to the contralateral.    ASSESSMENT:  A 44 year old male 5 weeks status post the above-mentioned procedure, doing very well.    PLAN:  We are happy that the patient is doing very well.  In terms of his work, we will let him go back to work as planned on Apr 07, 2020.  However, we are going to have him do light duty for 4 weeks.  He will have a 10 pound lifting limit.  No pushing or pulling or climbing ladders after 4 weeks and gradually increase his activity to full duty.  We do not anticipate she have much problems after this.  However, should he have any issues, he knows he can call us should they have any questions or concerns.   Otherwise, we will see him back on a p.r.n. basis.      Alba Destine, MD      Mattie Marlin, MD  Associate Professor, [facility] End[facility]   [facility] Department of Orthopaedics    I personally interviewed and examined this patient. I made the diagnosis and recommended the plan of care. The note was documented by the resident who saw them with me.    Mattie Marlin, MD 04/01/2020, 07:36                  DD:  03/31/2020 13:33:01  DT:  04/01/2020 00:11:21 MH  D#:  [id number]

## 2023-10-18 IMAGING — MR MRI FOREARM RT WO CONTRAST
4 of 6 series · 20 of 40 positions shown · non-contrast
Comparison: None available.

﻿EXAM:  26840   MRI FOREARM RT WO CONTRAST
INDICATION: Right forearm pain.
TECHNIQUE: Noncontrast multiplanar, multisequence MRI was performed.

[Series 6: T1 · axial · 6.0mm · 0.39mm/px · z∈[-133,+81]mm · 6 of 36 slices shown (1 of 3)]
[im 1/36]
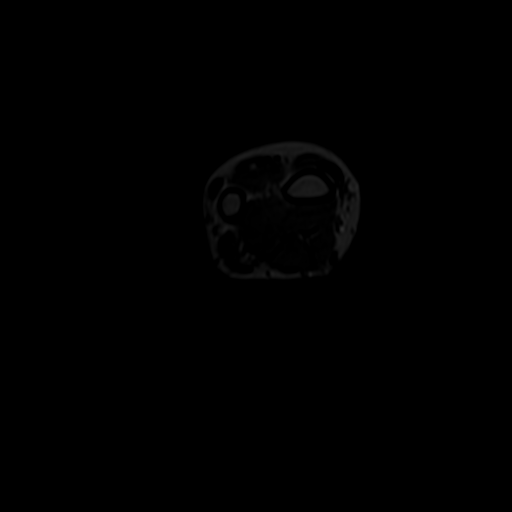
[im 4/36]
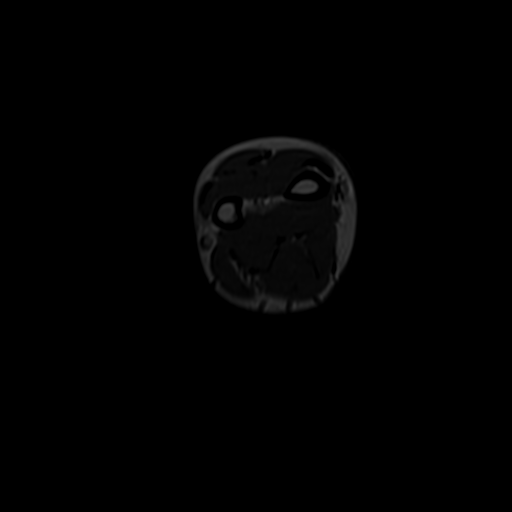
[im 12/36]
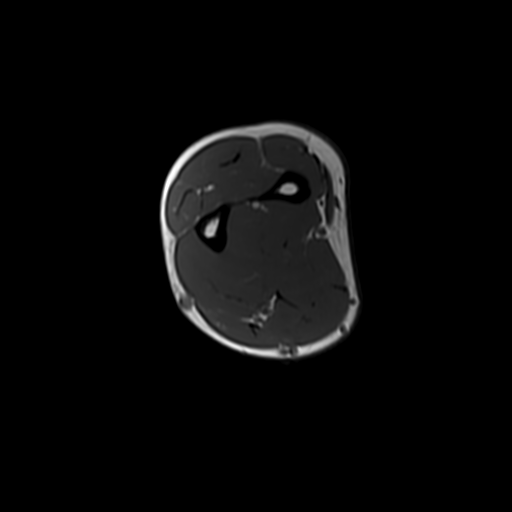
[im 16/36]
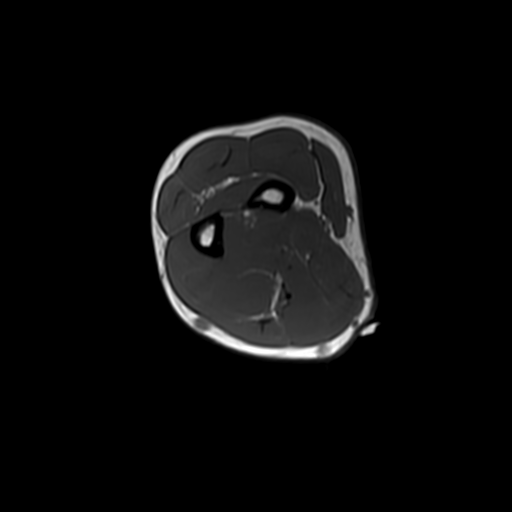
[im 20/36]
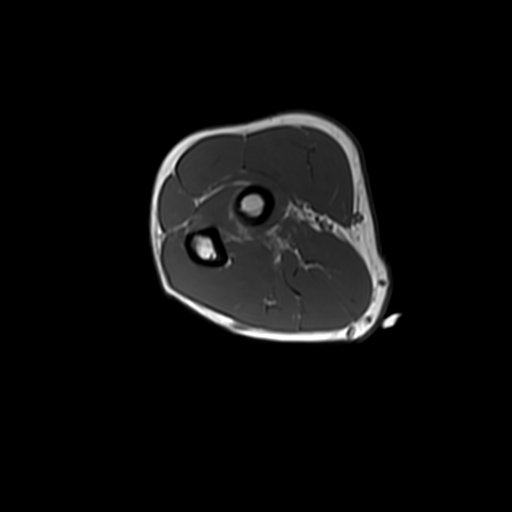
[im 32/36]
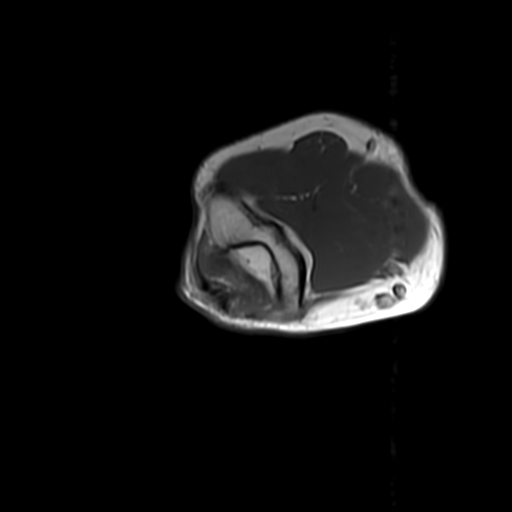

[Series 7: T2 fat-sat · axial · 6.0mm · 0.39mm/px · z∈[-133,+108]mm · 8 of 36 slices shown]
[im 1/36]
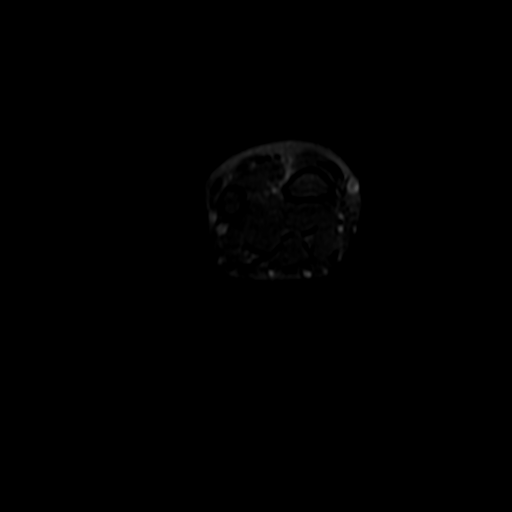
[im 4/36]
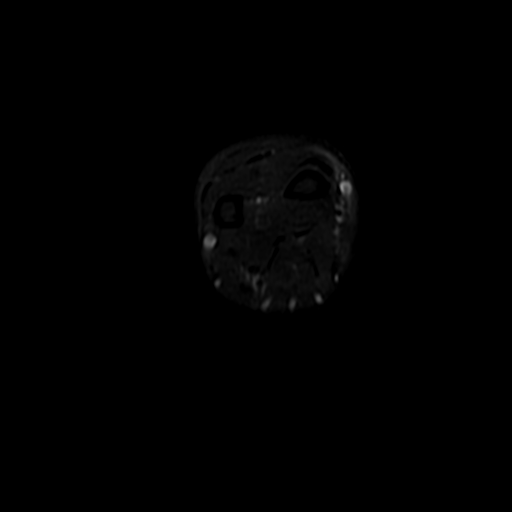
[im 12/36]
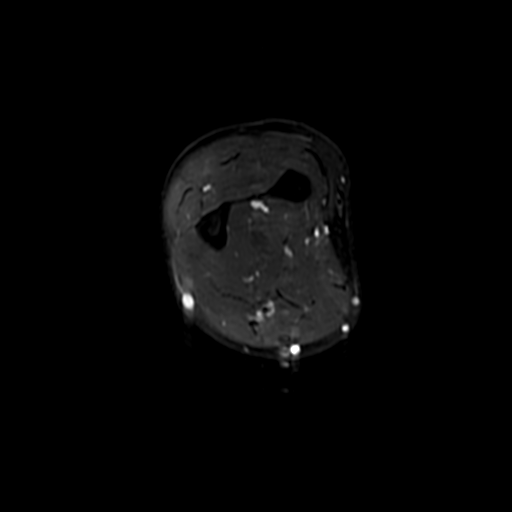
[im 16/36]
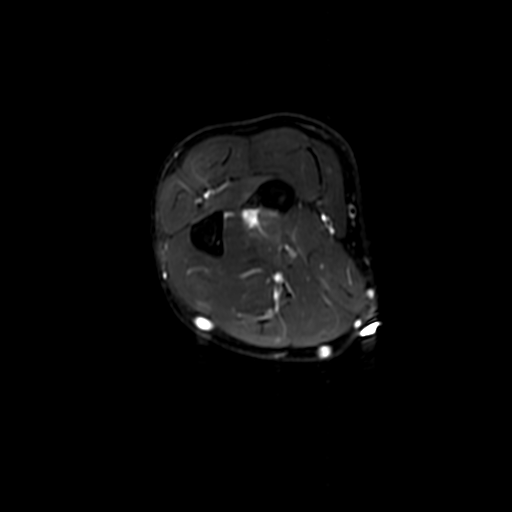
[im 20/36]
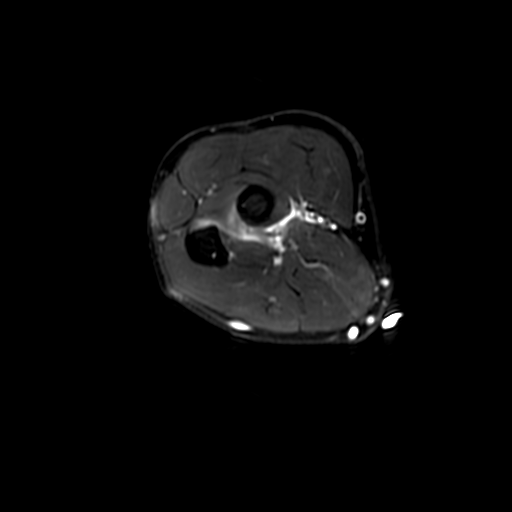
[im 24/36]
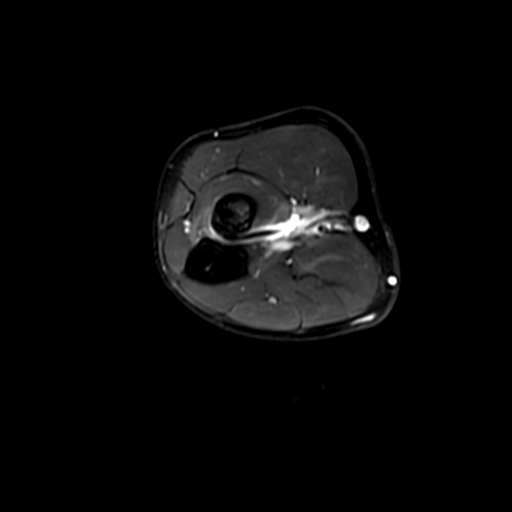
[im 32/36]
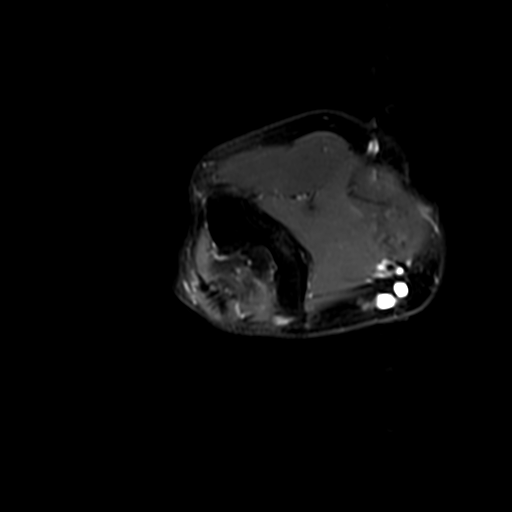
[im 36/36]
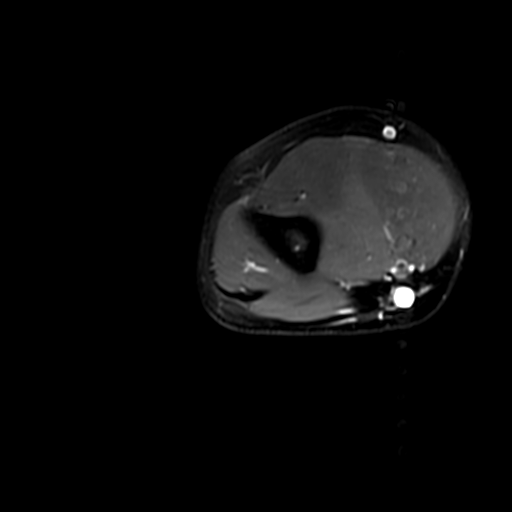

[Series 8: T1 · coronal · 5.0mm · 0.59mm/px · 3 of 20 slices shown (2 of 3)]
[im 1/20]
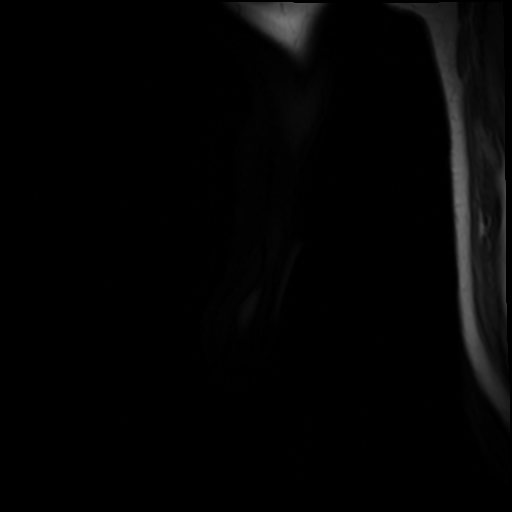
[im 10/20]
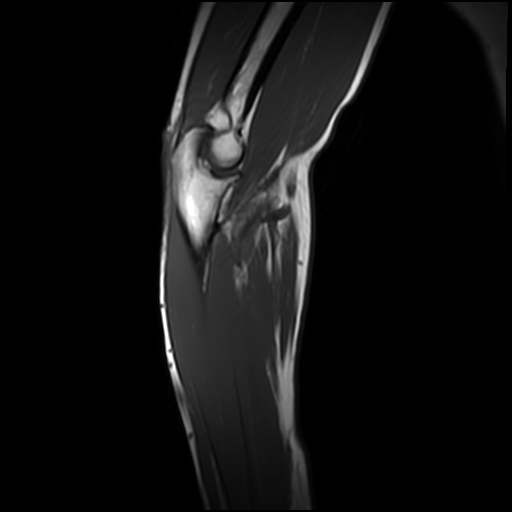
[im 20/20]
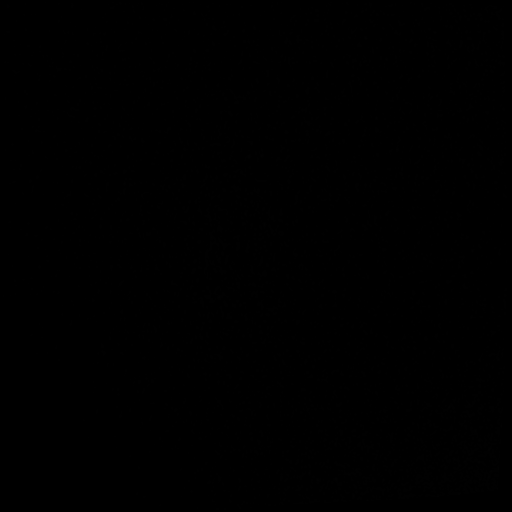

[Series 10: T1 · sagittal · 5.0mm · 0.59mm/px · 3 of 20 slices shown (3 of 3)]
[im 1/20]
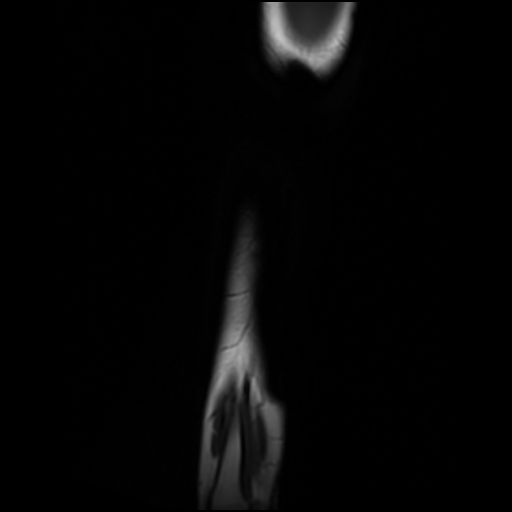
[im 10/20]
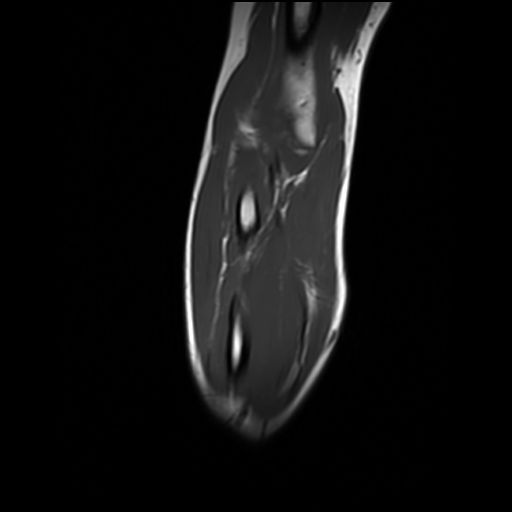
[im 20/20]
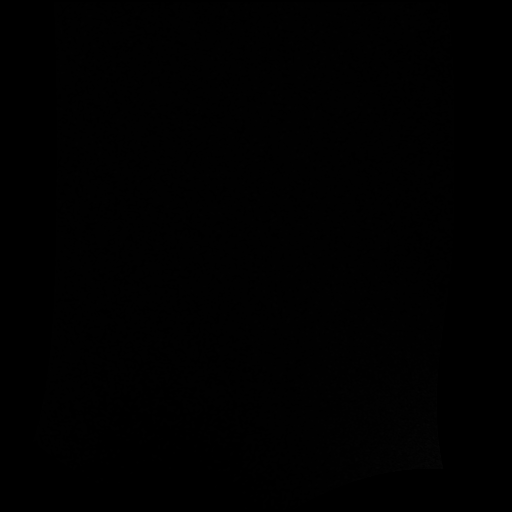

[20 of 40 positions shown; findings below may reference images not displayed]

FINDINGS: There is a tear of the distal biceps tendon approximately 2.5 cm from the tendon insertion on the radial tuberosity.  The proximal tendon fragment is poorly visualized but appears to be slightly retracted and coiled.  There is a small amount of associated fluid and edema in this region. 

No other tendon tear is seen.  There is no fracture, dislocation, bone contusion, or significant marrow signal alteration.  No ligament tear is seen.
IMPRESSION: Distal biceps tendon tear.

## 2023-10-24 ENCOUNTER — Other Ambulatory Visit: Payer: Self-pay

## 2023-10-24 ENCOUNTER — Ambulatory Visit: Payer: Managed Care, Other (non HMO) | Attending: PHYSICIAN ASSISTANT | Admitting: PHYSICIAN ASSISTANT

## 2023-10-24 ENCOUNTER — Encounter (HOSPITAL_BASED_OUTPATIENT_CLINIC_OR_DEPARTMENT_OTHER): Payer: Self-pay | Admitting: PHYSICIAN ASSISTANT

## 2023-10-24 VITALS — Temp 98.2°F | Ht 69.49 in | Wt 219.8 lb

## 2023-10-24 DIAGNOSIS — S46211A Strain of muscle, fascia and tendon of other parts of biceps, right arm, initial encounter: Secondary | ICD-10-CM | POA: Insufficient documentation

## 2023-10-24 DIAGNOSIS — S46212A Strain of muscle, fascia and tendon of other parts of biceps, left arm, initial encounter: Secondary | ICD-10-CM | POA: Insufficient documentation

## 2023-10-24 NOTE — Addendum Note (Signed)
Addended by: Junie Bame on: 10/24/2023 11:44 AM     Modules accepted: Orders

## 2023-10-24 NOTE — H&P (Signed)
PATIENT NAME: Wilkins, Martin   HOSPITAL NUMBER:  [id number]  DATE OF SERVICE: 10/24/2023  DATE OF BIRTH:  1976-08-09    HISTORY AND PHYSICAL    REASON FOR VISIT:  Right distal biceps injury.    DATE OF INJURY:  October 14, 2023.    HISTORY OF PRESENT ILLNESS:  Martin Wilkins is a 47 year old right-hand dominant gentleman who presents to the clinic today for evaluation his right elbow injury. The patient sustained injury to his right elbow about 10 days ago on October 14, 2023.  The patient states he was moving some brush and he grabbed a large branch with his right hand, went to pull on it, and felt a pop in his right biceps.  He has had some pain. He did have some swelling and ecchymosis initially that has resolved.  Occasionally he still has some numbness and tingling in the fifth and fourth digit on his right hand.  He had been seen at an outside clinic and had an MRI obtained which showed concerns of a biceps rupture and was referred to my clinic for further consultation.  Currently rates the pain in his right elbow about a 4 to 5/10.  He has been taking ibuprofen as needed.  No prior surgeries to the right elbow in the past.    PAST MEDICAL HISTORY:  Significant for sleep apnea, gastroesophageal reflux disease, hypertension, and high cholesterol.    PAST SURGICAL HISTORY:  Kidney stone removal, colonoscopy, left wrist surgery, hemorrhoidectomy, and wisdom teeth extraction.  He denies any problems or complications with anesthesia.    CURRENT MEDICATION:  1. Zepbound 5 mg once a week.   2. Omeprazole 20 mg daily.   3. Lisinopril/hydrochlorothiazide 20/12.5 mg daily.   4. Metoprolol 25 mg daily.    ALLERGIES:  The patient reports drug allergies to IV CONTRAST DYE.    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  Denies any smoking, tobacco or alcohol use.  He is married.    FAMILY  HISTORY:  Significant for diabetes, heart attack, hypertension, high cholesterol, and stroke.    REVIEW OF SYSTEMS:  Other than the patient's right elbow, all systems are negative. Denies nausea, vomiting, fevers, chills, cough, or chest pain.    PHYSICAL EXAMINATION:  Height 176.5 cm, weight 99.7 kg.  Temperature 36.8 degrees Celsius.  Martin Wilkins is a pleasant gentleman in no acute distress.  Appropriate mood and affect.  Alert and oriented x3.  Pupils are equal and round.  Respirations unlabored.  Skin: Intact.  Abdomen: Nondistended.  Examination of the right elbow shows no swelling or deformity noted at this point.  He actually has a negative hook test, although he is tender to palpation over the distal biceps tendon.  He has weakness with elbow flexion and significant weakness with right forearm supination.  He is able to supinate it, but it does cause significant pain as well.  The biceps tendon distally appears to be intact around the antecubital fossa.  He is able to move all fingers on his right hand.  Radial pulse 2+ in the right wrist. He is neurovascularly intact in the right upper extremity.    IMAGING:  MRI of the right elbow was reviewed on an outside study. This was uploaded to [facility].  This was reviewed and shows evidence of a rupture to the biceps tendon that appears to  be mid tendon substance.  I reviewed the MRI report that was available on the patient's phone.  The radiologist also confirmed a rupture of the tendon 2.5 cm from its insertion on the radius.    ASSESSMENT:  Right elbow distal biceps tendon midsubstance tear.    PLAN:  I talked with Martin Wilkins today about his injury.  He does have an injury and mechanism tear of the biceps. On exam, he actually has a negative hook test and the biceps may be attached to the aponeurosis. In regard to his physical examination, he is not getting a lot of retraction in the distal tendon.  He does have pain and weakness with resisted supination.   I have talked with him about the injury.  I have reviewed his MRI.  It is unusual to see a midsubstance injury to the tendon as the distal part is actually attached to the tendon.  I actually had the patient seen in consultation with Dr. Nicki Reaper today about the injury and reviewed the MRI.  He agreed that it also looked like there was a tear at the tendon midsubstance. In talking with Dr. Nicki Reaper, we felt it was reasonable to give the patient another couple weeks to see how he is responding in regard to pain and function of the right elbow.  I have asked Martin Wilkins to follow up in Dr. Nicki Reaper clinic in 2 weeks, in the 1st week of December.  He understands he will see Dr. Nicki Reaper at that time to determine if surgery is indicated.  In the meantime, I recommend that he stop taking his Zebound medication prior to that visit so that if surgery is schedule, he would have already been off that for a week.  He does not need to use the sling at this time.  I did recommend that he not do any lifting pushing or pulling with the right elbow.  I have given him a note to have him off work from October 24, 2023, through November 23, 2023.  He will follow up in my clinic on an as-needed basis and he will see Dr. Nicki Reaper in 2 weeks to discuss surgical consultation.     The patient was seen as a shared visit with Dr. Nicki Reaper.        Karlene Einstein, PA-C                DD:  10/24/2023 12:24:20  DT:  10/24/2023 12:54:50 RO  D#:  [id number]

## 2023-10-25 ENCOUNTER — Encounter (HOSPITAL_BASED_OUTPATIENT_CLINIC_OR_DEPARTMENT_OTHER): Payer: Self-pay | Admitting: PHYSICIAN ASSISTANT

## 2023-10-26 ENCOUNTER — Telehealth (HOSPITAL_BASED_OUTPATIENT_CLINIC_OR_DEPARTMENT_OTHER): Payer: Self-pay

## 2023-10-26 NOTE — Telephone Encounter (Signed)
10/25/2023  3:57 PM EST    Summary: FW: Forms Needing Action    Copied From CRM #[id number].  Aker, Martin Wilkins called for forms needing action.    Martin Wilkins pt    Pt states that his short term disability paperwork was sent this morning and wanted to check to see if it was received  Please return call  Thank you!            ______________________  ATC returned call to patient and informed him that his forms had been completed, they were faxed back to [employer] this morning.    Patient voiced understanding and expressed thanks.   Martin Logan, MS, LAT, ATC 10/26/2023, 12:15

## 2023-11-06 ENCOUNTER — Ambulatory Visit (HOSPITAL_BASED_OUTPATIENT_CLINIC_OR_DEPARTMENT_OTHER): Payer: Managed Care, Other (non HMO) | Admitting: Radiology

## 2023-11-06 ENCOUNTER — Other Ambulatory Visit: Payer: Self-pay

## 2023-11-06 ENCOUNTER — Ambulatory Visit: Payer: Managed Care, Other (non HMO) | Attending: Orthopaedic Surgery | Admitting: Orthopaedic Surgery

## 2023-11-06 VITALS — Temp 95.2°F | Ht 69.45 in | Wt 226.0 lb

## 2023-11-06 DIAGNOSIS — S46211S Strain of muscle, fascia and tendon of other parts of biceps, right arm, sequela: Secondary | ICD-10-CM

## 2023-11-06 DIAGNOSIS — S46211A Strain of muscle, fascia and tendon of other parts of biceps, right arm, initial encounter: Secondary | ICD-10-CM | POA: Insufficient documentation

## 2023-11-06 NOTE — Procedures (Signed)
[name], [facility]  [address]  [place] [address]  Operated by [facility]  Procedure Note    Name: Martin Wilkins MRN:  [id number]   Date: 11/06/2023 DOB:  04/27/1976 (47 y.o.)         AMB [facility] [place] COMPLETE    Performed by: Lennie Muckle, MD  Authorized by: Parthenia Ames, PA-C        Ultrasound Examination Note  Indication for Procedure:  Right elbow injury, rule out it biceps tendon rupture.  Previous MRI showed fluid collection but intact distal biceps insertion.  Findings:  I used a high-frequency (15-6 MHz) ultrasound probe.  I scanned the antecubital fossa.  Identified the biceps muscle and the tendon.  I traced it down to the insertion.  The insertion was intact.  The previously reported fluid collection was not identified.  There was 1 area in the middle of the biceps tendon that I was not able to assess on ultrasound which may indicate a tear or just difficulty with visualization.  The muscle tendon junction is intact.  Impression:  Right biceps injury, rule out midsubstance tear.  No retraction.    Martin Barbara, MD  Associate Professor  Hand and Upper Extremity Surgery  Orthopedic Sports Medicine    Martin Gonser Annitta Needs, MD

## 2023-11-06 NOTE — H&P (Signed)
[facility]  DEPARTMENT OF [place], [place] [address]       PATIENT NAME: Martin Wilkins, HEGLUND   HOSPITAL NUMBER:  [id number]  DATE OF SERVICE: 11/06/2023  DATE OF BIRTH:  Aug 22, 1976    HISTORY AND PHYSICAL.    REASON FOR VISIT:  Right distal biceps injury.    DATE OF INJURY:  October 14, 2023.    HISTORY OF PRESENT ILLNESS:  [name] is a very pleasant 47 year old right hand dominant male who presents to the clinic for evaluation of his right elbow injury.  The patient was seen in the sports clinic on October 24, 2023, and was referred to the hand clinic at that time.  The patient reports that on October 14, 2023, he was clearing brush when he pulled up some brush and felt a pop in his right elbow at which time he felt initial paresthesias down his arm and severe pain.  Since that time he has continued to feel weak and whenever climbing a ladder or using a wrench at work he has increased pain and weakness.      Additionally, the patient complains of paresthesias in the morning or when hyperflexing his elbow.  He says the pain seems to resolve whenever he has his arm straight or during the day.  He is unsure if this is related.    PAST MEDICAL HISTORY:  The patient has a past history significant for sleep apnea, GERD, hypertension and high cholesterol.    The patient denies any history of blood clots or bleeding disorders.    PAST SURGICAL HISTORY:  The patient has had a left carpal tunnel and a hemorrhoidectomy.    Denies any problems or complications with anesthesia in the past.    MEDICATIONS:  1. Zepbound 5 mg once a week.  2. Omeprazole 20 mg daily.  3. Lisinopril/hydrochlorothiazide 20/12.5 mg daily.  4. Metoprolol 25 mg daily.    ALLERGIES:  The patient is allergic to IV CONTRAST.    SOCIAL HISTORY:  The patient denies smoking or tobacco use.  Denies alcohol use.  Denies any recreational drug use.  The patient is a Copywriter, advertising as well as a Cytogeneticist.    IMAGING:  MRI from 10/18/23 was reviewed by myself and Dr. Nicki Reaper demonstrates a partially torn biceps with inflammation and swelling seen abundantly around biceps. Biceps tear was not torn at most distal aspect (insertion with bone), but seen more proximal.       Ultrasound of the right elbow performed today in clinic demonstrates a partially intact right biceps.  No clear tear demonstrated on ultrasound today.  No effusion or swelling seen.    PHYSICAL EXAMINATION:  Right upper extremity:    The patient has intact AIN, PIN, able to make okay sign,  abduct fingers, cross index and middle finger, and give thumbs up.    The patient has a negative hooks test.    The patient is able to resist supination although it causes pain.   The patient is able to flex against resist and felt that this causes pain.  Tinel's over cubital tunnel is positive with paresthesias.    Sensation intact in ulnar, median, and radial distribution without paresthesias.    Radial and ulnar pulses palpable.    ASSESSMENT AND PLAN:  [name] is a 47 year old male who likely has a partial distal right biceps tendon tear.  I discussed in an in depth conversation with the patient  surgical options and alternatives.  I discussed that surgery would likely involve a reconstruction of the biceps tendon instead of a repair at which time he would likely need a cadaver biceps tendon graft.  I additionally discussed with the patient that it is possible during surgery to open him up and see an intact or healed biceps tendon.  If surgery were to happen it would be about a 48-month recovery.  I gave the patient the option of returning to work today and seeing how he does in about 2 to 3 weeks.      After a long discussion of the risks, benefits, and alternatives to surgery, the patient would like to return to work for the next 2-3 weeks to see if he is able to continue with his job for now. Patient given Work note to return to work this Thursday  11/08/23.    Follow up:  2-3 weeks for clinic check and discuss options of continued management at taht time. No XR.       Jamey Ripa. Kennon Rounds, MD    Shafic Annitta Needs, MD  Associate Professor  Hand, Elbow, and Shoulder Surgery  Orthopaedic Sports Medicine              DD:  11/06/2023 09:38:53  DT:  11/06/2023 10:33:16 JR  D#:  [id number]  Attending Physician Statement: I evaluated the patient named above personally and participated in / performed the significant components of this encounter.    SUBSTANTIVE FINDINGS, IMPRESSION and PLAN:  The encounter diagnosis was Traumatic partial tear of right biceps tendon, sequela.  Discussed treatment options.  He will come back in 2-3 weeks for another evaluation.  Please refer to the above documentation for more details.    The patient^ named above expressed understanding of the diagnosis and agrees with treatment as described above.  I asked for any questions and concerns and addressed them accordingly.  Note: This documentation was completed with the assistance of voice recognition software and automated field population which may result in unintentional documentation errors and redundancy.    ^ and/or Guardian/ MPOA when appropriate.    Rivka Barbara, MD, MBA  Associate Professor  Hand, Elbow, and Shoulder Surgery  Orthopedic Sports Medicine

## 2023-11-29 ENCOUNTER — Ambulatory Visit: Payer: Managed Care, Other (non HMO) | Attending: Orthopaedic Surgery | Admitting: Orthopaedic Surgery

## 2023-11-29 ENCOUNTER — Encounter (HOSPITAL_BASED_OUTPATIENT_CLINIC_OR_DEPARTMENT_OTHER): Payer: Self-pay | Admitting: Orthopaedic Surgery

## 2023-11-29 ENCOUNTER — Other Ambulatory Visit: Payer: Self-pay

## 2023-11-29 VITALS — Temp 97.2°F | Ht 69.45 in | Wt 225.1 lb

## 2023-11-29 DIAGNOSIS — S46211S Strain of muscle, fascia and tendon of other parts of biceps, right arm, sequela: Secondary | ICD-10-CM | POA: Insufficient documentation

## 2023-11-29 DIAGNOSIS — S46211D Strain of muscle, fascia and tendon of other parts of biceps, right arm, subsequent encounter: Secondary | ICD-10-CM

## 2023-11-29 NOTE — Progress Notes (Signed)
[facility]  DEPARTMENT OF [place], [place] [address]       PATIENT NAME: JUANYE, ZIOBRO   HOSPITAL NUMBER:  [id number]  DATE OF SERVICE:  11/29/2023   DATE OF BIRTH:  08-02-1976    HISTORY AND PHYSICAL.    REASON FOR VISIT:  Right distal biceps injury.    DATE OF INJURY:  October 14, 2023.    HISTORY OF PRESENT ILLNESS:  Mr. Raygen Castiglioni is following up for his right elbow injury.  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  It is not bad enough to proceed with surgery.    Examination shows a palpable right distal biceps tendon  .  ASSESSMENT AND PLAN:  Likely partial distal right biceps tendon tear.  I we discussed treatment options including exploration and repair and/or reconstruction as needed versus observation.  We also discussed therapy.  The patient would like to observe.  I will see him on a p.r.n. basis.    The patient^ named above expressed understanding of the diagnosis and agrees with treatment as described above.  I asked for any questions and concerns and addressed them accordingly.  Note: This documentation was completed with the assistance of voice recognition software and automated field population which may result in unintentional documentation errors and redundancy.    ^ and/or Guardian/ MPOA when appropriate.    Rivka Barbara, MD, MBA  Associate Professor  Hand, Elbow, and Shoulder Surgery  Orthopedic Sports Medicine

## 2024-04-22 ENCOUNTER — Encounter (INDEPENDENT_AMBULATORY_CARE_PROVIDER_SITE_OTHER): Payer: Self-pay
